# Patient Record
Sex: Female | Born: 1990 | Race: Black or African American | Hispanic: No | Marital: Single | Smoking: Never smoker
Health system: Southern US, Community
[De-identification: ages and names within clinical notes are randomized; demographics above are authoritative.]

---

## 2020-06-17 ENCOUNTER — Emergency Department: Payer: BC Managed Care – PPO

## 2020-06-17 ENCOUNTER — Emergency Department
Admission: EM | Admit: 2020-06-17 | Discharge: 2020-06-18 | Disposition: A | Discharge status: Home / Self Care | Payer: BC Managed Care – PPO | Attending: Emergency Medicine | Admitting: Emergency Medicine

## 2020-06-17 ENCOUNTER — Other Ambulatory Visit: Payer: Self-pay

## 2020-06-17 DIAGNOSIS — Y9289 Other specified places as the place of occurrence of the external cause: Secondary | ICD-10-CM | POA: Insufficient documentation

## 2020-06-17 DIAGNOSIS — S01112A Laceration without foreign body of left eyelid and periocular area, initial encounter: Secondary | ICD-10-CM

## 2020-06-17 DIAGNOSIS — S0181XA Laceration without foreign body of other part of head, initial encounter: Secondary | ICD-10-CM | POA: Insufficient documentation

## 2020-06-17 DIAGNOSIS — Z23 Encounter for immunization: Secondary | ICD-10-CM | POA: Diagnosis not present

## 2020-06-17 DIAGNOSIS — T07XXXA Unspecified multiple injuries, initial encounter: Secondary | ICD-10-CM

## 2020-06-17 LAB — POC URINE PREG, ED: Preg Test, Ur: NEGATIVE

## 2020-06-17 LAB — URINALYSIS, COMPLETE (UACMP) WITH MICROSCOPIC
Bilirubin Urine: NEGATIVE
Glucose, UA: NEGATIVE mg/dL
Ketones, ur: 80 mg/dL — AB
Nitrite: POSITIVE — AB
Protein, ur: 100 mg/dL — AB
RBC / HPF: >50 RBC/hpf — ABNORMAL HIGH (ref 0–5)
Specific Gravity, Urine: 1.027 (ref 1.005–1.030)
WBC, UA: >50 WBC/hpf — ABNORMAL HIGH (ref 0–5)
pH: 5 (ref 5.0–8.0)

## 2020-06-17 MED ORDER — LIDOCAINE-EPINEPHRINE (PF) 2 %-1:200000 IJ SOLN
10.0000 mL | Freq: Once | INTRAMUSCULAR | Status: AC
  Administered 2020-06-17: 10 mL

## 2020-06-17 MED ORDER — LIDOCAINE HCL (PF) 1 % IJ SOLN
10.0000 mL | Freq: Once | INTRAMUSCULAR | Status: DC
  Filled 2020-06-17: qty 10

## 2020-06-17 MED ORDER — METHOCARBAMOL 500 MG PO TABS: 500.0000 mg | ORAL_TABLET | Freq: Four times a day (QID) | ORAL | 0 refills | Status: AC

## 2020-06-17 MED ORDER — TETANUS-DIPHTH-ACELL PERTUSSIS 5-2.5-18.5 LF-MCG/0.5 IM SUSY
0.5000 mL | PREFILLED_SYRINGE | Freq: Once | INTRAMUSCULAR | Status: AC
  Administered 2020-06-17: 0.5 mL via INTRAMUSCULAR
  Filled 2020-06-17: qty 0.5

## 2020-06-17 MED ORDER — MELOXICAM 15 MG PO TABS: 15.0000 mg | ORAL_TABLET | Freq: Every day | ORAL | 0 refills | Status: AC

## 2020-06-17 NOTE — ED Notes (Signed)
Patient's mother gave the following information: Patient's child name is: Leah Anthony, birthdate 08/31/2017. The child is at the patient's mother's house where the following people live: Patient's mother Alyson Ki [phone] Patient's dad Rosalee Kaufman, Patient's brother Wilhelmena Zea, age 28 Patient's sister Ryker Pherigo, age 2

## 2020-06-17 NOTE — ED Notes (Signed)
Pt to CT

## 2020-06-17 NOTE — ED Notes (Signed)
Visual merchandiser for SANE nurse to be paged to come see pt.
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
## 2020-06-17 NOTE — ED Triage Notes (Signed)
Pt presents via POC with laceration above left eye. Bleeding controlled.

## 2020-06-17 NOTE — ED Notes (Signed)
Pt on phone with [facility] police dept.
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
## 2020-06-17 NOTE — ED Notes (Signed)
Report given to [facility].
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
## 2020-06-17 NOTE — ED Notes (Signed)
SANE RN at bedside.

## 2020-06-17 NOTE — ED Notes (Signed)
SANE RN is still at bedside. Mother is at bedside. Patient ambulated to and from hallway bathroom with a steady gait.
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
## 2020-06-17 NOTE — ED Notes (Signed)
Report given to Dawn RN
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
## 2020-06-24 ENCOUNTER — Encounter: Payer: Self-pay | Admitting: Intensive Care

## 2020-06-24 ENCOUNTER — Emergency Department
Admission: EM | Admit: 2020-06-24 | Discharge: 2020-06-24 | Disposition: A | Discharge status: Home / Self Care | Payer: BC Managed Care – PPO | Attending: Emergency Medicine | Admitting: Emergency Medicine

## 2020-06-24 ENCOUNTER — Other Ambulatory Visit: Payer: Self-pay

## 2020-06-24 DIAGNOSIS — S0181XD Laceration without foreign body of other part of head, subsequent encounter: Secondary | ICD-10-CM | POA: Diagnosis not present

## 2020-06-24 DIAGNOSIS — Z4802 Encounter for removal of sutures: Secondary | ICD-10-CM | POA: Insufficient documentation

## 2020-06-24 NOTE — ED Triage Notes (Signed)
PAtient here for stitch removal
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
## 2020-06-25 NOTE — SANE Note (Addendum)
Called patient and left a voice mail with my phone number on 06/26/2019 at [phone]

## 2020-06-25 NOTE — SANE Note (Signed)
ON 06/22/2020, THE PT (Leah Anthony:  [phone]) CALLED AND LEFT A VM ON THE OFFICE PHONE FOR MELISSA, FNE, TO CALL HER.  ON 06/25/2020, AT APPROXIMATELY 1115 HOURS, I PHONED THE PT TO SEE IF THERE WAS SOMETHING THAT I COULD ASSIST HER WITH, AS THAT FNE WORKS NIGHT SHIFT.    THE PT DECLINED MY OFFER AND STATED THAT SHE WOULD LIKE TO SPEAK WITH MELISSA, FNE.  THE PT WAS FURTHER ADVISED THAT THE FNE WOULD BE WORKING FROM 11P-7A TONIGHT, AND WAS ASKED IF THE PT OKAY WITH RECEIVING A TELEPHONE CALL BACK IN THE MIDDLE OF THE NIGHT.  THE PT STATED THAT WOULD BE FINE, OR THAT THE FNE COULD CALL HER TOMORROW NIGHT.  THE PT WAS THEN ADVISED THAT THE FNE ONLY WORKED WEEKEND NIGHTS.  THE PT STATED THAT IT WOULD BE FINE IF THE FNE DID NOT CALL TONIGHT THAT SHE COULD CALL THE PT BACK NEXT Friday.  THE PT WAS ADVISED THAT IF SHE THOUGHT I, OR ANOTHER COWORKER, COULD ASSIST HER THEN TO CALL THE OFFICE NUMBER BACK.  THE PT VERBALIZED HER UNDERSTANDING.

## 2021-06-18 IMAGING — CR DG FOREARM 2V*R*
1 series · 2 of 2 positions shown · non-contrast
Comparison: None.

CLINICAL DATA: Pain

EXAM:
RIGHT FOREARM - 2 VIEW

[Series 1: dg forearm right · 0.14mm/px · 2 of 2 slices shown]
[im 1/2]
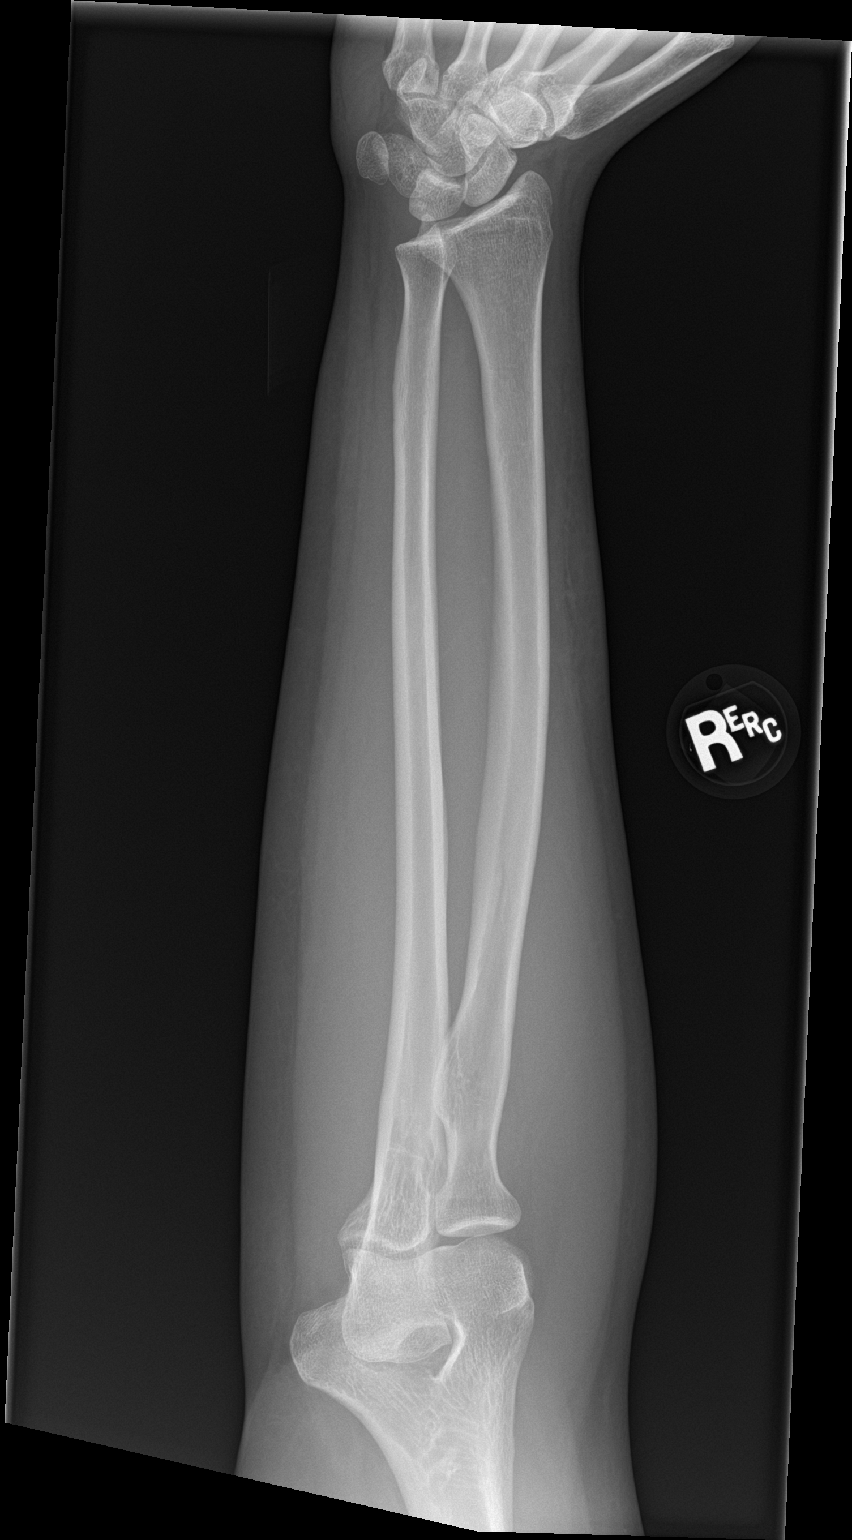
[im 2/2]
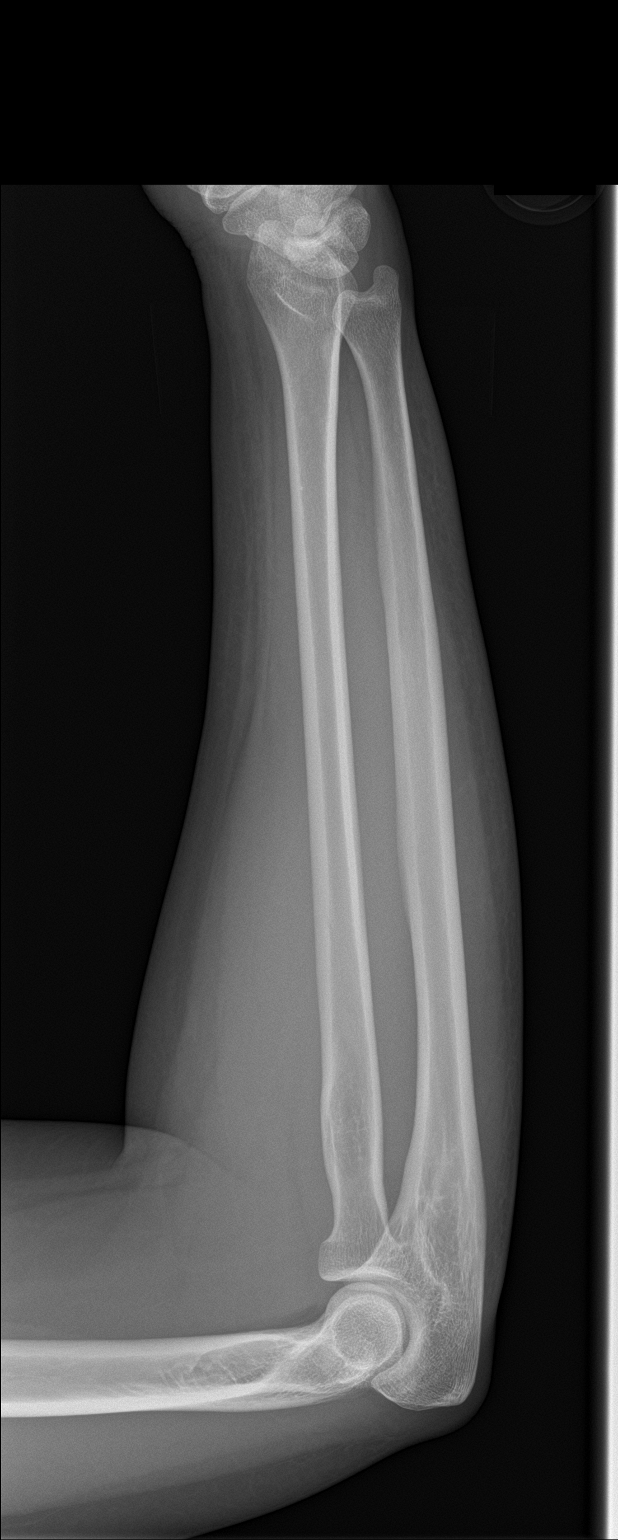

[2 of 2 positions shown; findings below may reference images not displayed]

FINDINGS: There is no evidence of fracture or other focal bone lesions. Soft
tissues are unremarkable.
IMPRESSION: Negative.
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
